# Patient Record
Sex: Male | Born: 1965 | Race: Black or African American | Hispanic: No | Marital: Single | Smoking: Never smoker
Health system: Southern US, Community
[De-identification: ages and names within clinical notes are randomized; demographics above are authoritative.]

## PROBLEM LIST (undated history)

## (undated) DIAGNOSIS — B20 Human immunodeficiency virus [HIV] disease: Secondary | ICD-10-CM

## (undated) DIAGNOSIS — I219 Acute myocardial infarction, unspecified: Secondary | ICD-10-CM

## (undated) DIAGNOSIS — W3400XA Accidental discharge from unspecified firearms or gun, initial encounter: Secondary | ICD-10-CM

## (undated) HISTORY — DX: Acute myocardial infarction, unspecified: I21.9

## (undated) HISTORY — DX: Accidental discharge from unspecified firearms or gun, initial encounter: W34.00XA

## (undated) HISTORY — DX: Human immunodeficiency virus (HIV) disease: B20

---

## 1989-09-04 DIAGNOSIS — W3400XA Accidental discharge from unspecified firearms or gun, initial encounter: Secondary | ICD-10-CM

## 1989-09-04 HISTORY — PX: ILEOCECETOMY: SHX5857

## 1989-09-04 HISTORY — DX: Accidental discharge from unspecified firearms or gun, initial encounter: W34.00XA

## 1993-09-04 DIAGNOSIS — B20 Human immunodeficiency virus [HIV] disease: Secondary | ICD-10-CM

## 1993-09-04 HISTORY — DX: Human immunodeficiency virus (HIV) disease: B20

## 2002-09-04 DIAGNOSIS — I219 Acute myocardial infarction, unspecified: Secondary | ICD-10-CM

## 2002-09-04 HISTORY — DX: Acute myocardial infarction, unspecified: I21.9

## 2005-10-17 ENCOUNTER — Ambulatory Visit: Payer: Self-pay | Admitting: Infectious Diseases

## 2005-10-17 ENCOUNTER — Encounter: Payer: Self-pay | Admitting: Infectious Diseases

## 2005-10-17 ENCOUNTER — Encounter (INDEPENDENT_AMBULATORY_CARE_PROVIDER_SITE_OTHER): Payer: Self-pay | Admitting: *Deleted

## 2005-10-17 LAB — CONVERTED CEMR LAB
CD4 Count: 700 microliters
CD4 T Cell Abs: 700

## 2005-11-27 ENCOUNTER — Encounter: Payer: Self-pay | Admitting: Infectious Diseases

## 2005-11-27 ENCOUNTER — Ambulatory Visit: Payer: Self-pay | Admitting: Infectious Diseases

## 2006-01-10 IMAGING — CR DG CHEST 1V PORT
1 series · 1 of 1 positions shown · non-contrast
Comparison: none

CLINICAL DATA: Chest pain in a nonsmoking.  
 PORTABLE CHEST:

[view not recorded]
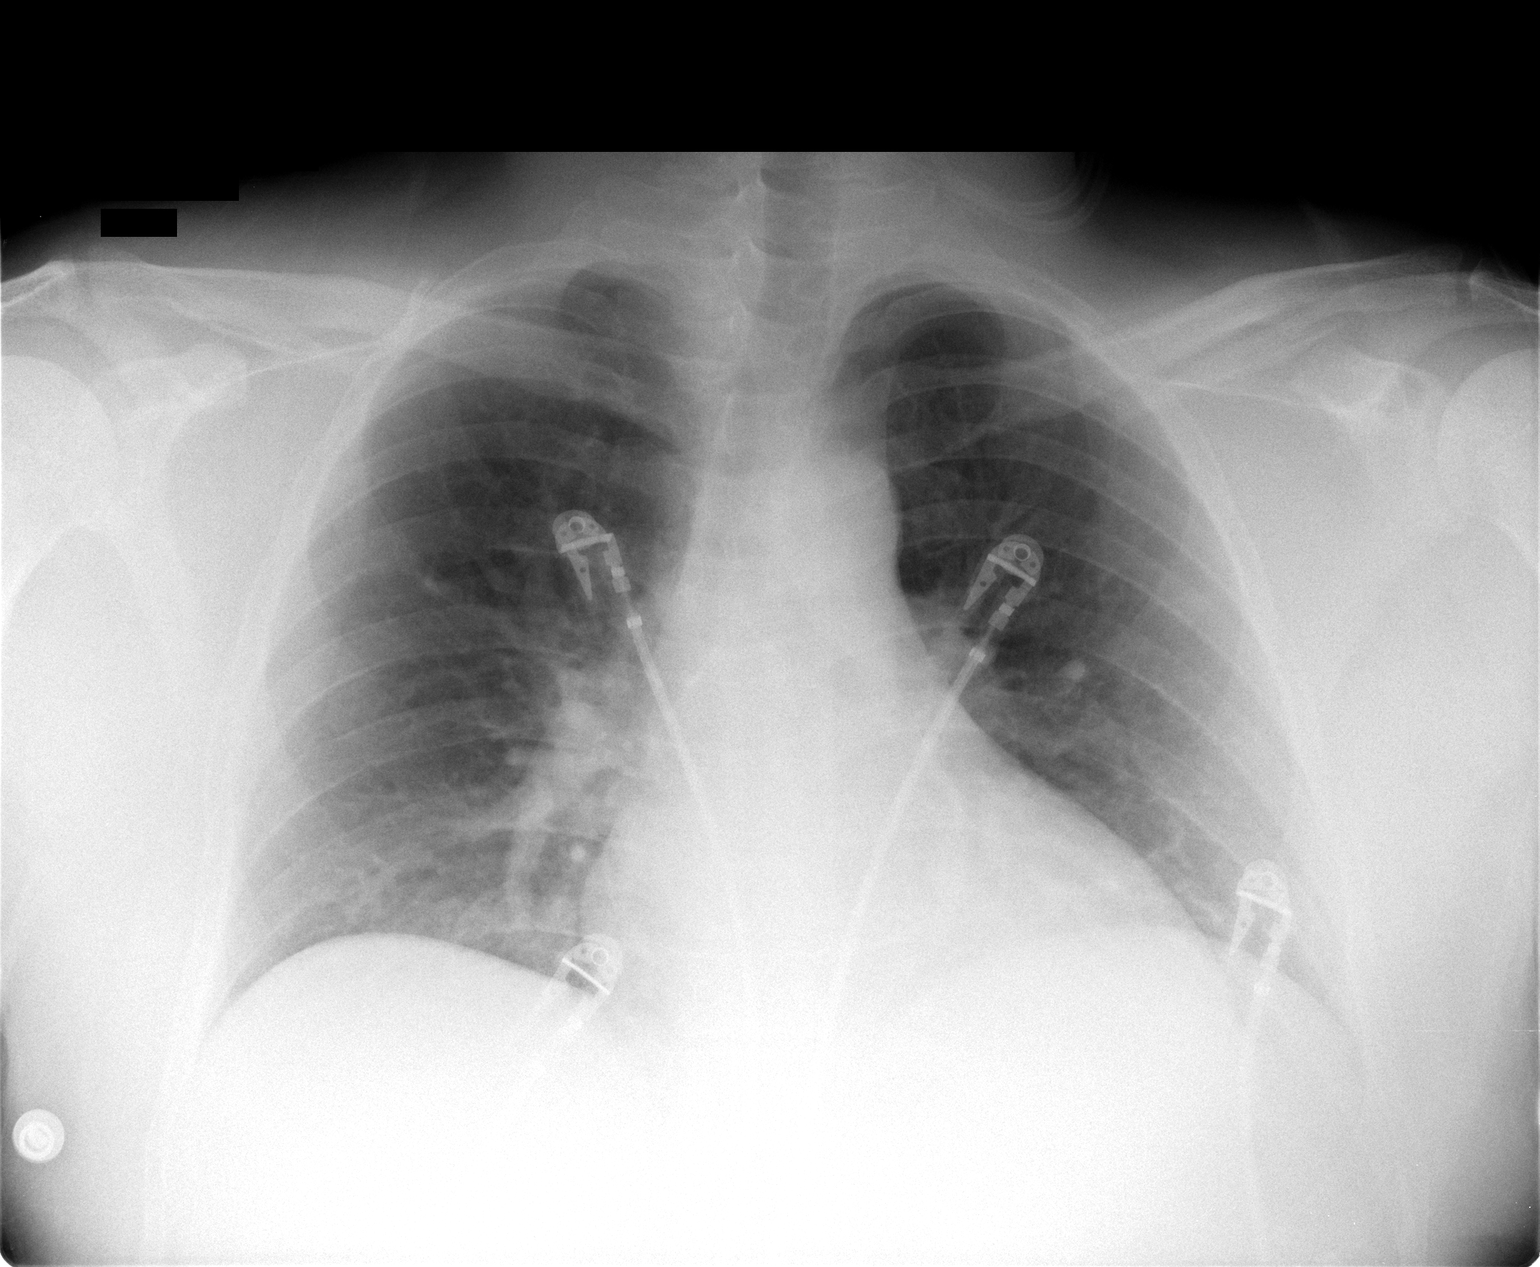

[1 of 1 positions shown; findings below may reference images not displayed]

FINDINGS: Lung volumes are low but the lungs are clear.  Heart size is normal.  No effusion or focal bony abnormality.
IMPRESSION: No acute disease.

## 2006-01-11 ENCOUNTER — Inpatient Hospital Stay (HOSPITAL_COMMUNITY): Admission: EM | Admit: 2006-01-11 | Discharge: 2006-01-12 | Payer: Self-pay | Admitting: Emergency Medicine

## 2006-10-29 ENCOUNTER — Encounter (INDEPENDENT_AMBULATORY_CARE_PROVIDER_SITE_OTHER): Payer: Self-pay | Admitting: *Deleted

## 2006-10-29 LAB — CONVERTED CEMR LAB

## 2006-11-11 ENCOUNTER — Encounter (INDEPENDENT_AMBULATORY_CARE_PROVIDER_SITE_OTHER): Payer: Self-pay | Admitting: *Deleted

## 2008-10-15 ENCOUNTER — Encounter: Payer: Self-pay | Admitting: Infectious Diseases

## 2009-11-11 ENCOUNTER — Ambulatory Visit: Payer: Self-pay | Admitting: Infectious Disease

## 2009-12-16 ENCOUNTER — Encounter: Payer: Self-pay | Admitting: Infectious Diseases

## 2009-12-20 ENCOUNTER — Ambulatory Visit: Payer: Self-pay | Admitting: Internal Medicine

## 2010-02-07 ENCOUNTER — Encounter (INDEPENDENT_AMBULATORY_CARE_PROVIDER_SITE_OTHER): Payer: Self-pay | Admitting: *Deleted

## 2010-02-24 ENCOUNTER — Ambulatory Visit: Payer: Self-pay | Admitting: Infectious Diseases

## 2010-04-14 ENCOUNTER — Ambulatory Visit: Payer: Self-pay | Admitting: Infectious Disease

## 2010-05-26 ENCOUNTER — Ambulatory Visit: Payer: Self-pay | Admitting: Internal Medicine

## 2010-09-04 HISTORY — PX: INCISION AND DRAINAGE: SHX5863

## 2010-10-04 NOTE — Miscellaneous (Signed)
Summary: Pt. @ [facility] clinic now, RW Form updated.  Clinical Lists Changes  Observations: Added new observation of PATNTCOUNTY: Duke Salvia (02/07/2010 16:25) Added new observation of RW VITAL STA: Relocated (02/07/2010 16:25) Added new observation of RWPARTICIP: No (02/07/2010 16:25)

## 2010-11-10 DIAGNOSIS — B20 Human immunodeficiency virus [HIV] disease: Secondary | ICD-10-CM

## 2010-12-06 ENCOUNTER — Other Ambulatory Visit: Payer: Self-pay | Admitting: *Deleted

## 2010-12-29 DIAGNOSIS — A4902 Methicillin resistant Staphylococcus aureus infection, unspecified site: Secondary | ICD-10-CM

## 2010-12-29 DIAGNOSIS — B20 Human immunodeficiency virus [HIV] disease: Secondary | ICD-10-CM

## 2010-12-29 DIAGNOSIS — Z23 Encounter for immunization: Secondary | ICD-10-CM
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
## 2011-01-25 ENCOUNTER — Inpatient Hospital Stay (HOSPITAL_COMMUNITY): Admission: AD | Admit: 2011-01-25 | Payer: Self-pay | Source: Ambulatory Visit | Admitting: Internal Medicine

## 2011-02-13 DIAGNOSIS — L0291 Cutaneous abscess, unspecified: Secondary | ICD-10-CM

## 2011-02-13 DIAGNOSIS — A4902 Methicillin resistant Staphylococcus aureus infection, unspecified site: Secondary | ICD-10-CM

## 2011-02-13 DIAGNOSIS — B20 Human immunodeficiency virus [HIV] disease: Secondary | ICD-10-CM

## 2011-02-13 DIAGNOSIS — R52 Pain, unspecified: Secondary | ICD-10-CM

## 2011-02-13 DIAGNOSIS — L0390 Cellulitis, unspecified: Secondary | ICD-10-CM

## 2011-03-16 DIAGNOSIS — B20 Human immunodeficiency virus [HIV] disease: Secondary | ICD-10-CM

## 2011-05-03 DIAGNOSIS — B20 Human immunodeficiency virus [HIV] disease: Secondary | ICD-10-CM

## 2011-05-03 DIAGNOSIS — L0293 Carbuncle, unspecified: Secondary | ICD-10-CM

## 2011-05-03 DIAGNOSIS — L0292 Furuncle, unspecified: Secondary | ICD-10-CM

## 2011-06-01 ENCOUNTER — Other Ambulatory Visit: Payer: Self-pay | Admitting: *Deleted

## 2011-06-01 NOTE — Telephone Encounter (Signed)
Dr Daiva Eves these Rx were sent over for this patient and they were not in his profile nor did they have any instructions. Being that they are new to the patient I wanted you to clarify what you intend for the patient to adhere to. Please advise and I will forward these to the patient pharmacy.  PLEASE ADVISE. tp

## 2011-06-05 ENCOUNTER — Other Ambulatory Visit: Payer: Self-pay | Admitting: *Deleted

## 2011-06-05 DIAGNOSIS — B20 Human immunodeficiency virus [HIV] disease: Secondary | ICD-10-CM

## 2011-06-05 MED ORDER — DARUNAVIR ETHANOLATE 400 MG PO TABS: 800.0000 mg | ORAL_TABLET | Freq: Every day | ORAL | Status: DC

## 2011-06-05 MED ORDER — EMTRICITABINE-TENOFOVIR DF 200-300 MG PO TABS: 1.0000 | ORAL_TABLET | Freq: Every day | ORAL | Status: DC

## 2011-06-05 MED ORDER — RITONAVIR 100 MG PO TABS: 100.0000 mg | ORAL_TABLET | Freq: Every day | ORAL | Status: DC

## 2011-07-06 DIAGNOSIS — B20 Human immunodeficiency virus [HIV] disease: Secondary | ICD-10-CM

## 2011-09-21 ENCOUNTER — Other Ambulatory Visit: Payer: Self-pay | Admitting: Licensed Clinical Social Worker

## 2011-09-21 DIAGNOSIS — B20 Human immunodeficiency virus [HIV] disease: Secondary | ICD-10-CM

## 2011-09-21 DIAGNOSIS — Z21 Asymptomatic human immunodeficiency virus [HIV] infection status: Secondary | ICD-10-CM

## 2011-09-21 MED ORDER — DARUNAVIR ETHANOLATE 800 MG PO TABS: 800.0000 mg | ORAL_TABLET | Freq: Every day | ORAL | Status: DC

## 2011-10-05 DIAGNOSIS — B20 Human immunodeficiency virus [HIV] disease: Secondary | ICD-10-CM

## 2012-02-06 DIAGNOSIS — B20 Human immunodeficiency virus [HIV] disease: Secondary | ICD-10-CM

## 2012-02-29 DIAGNOSIS — B20 Human immunodeficiency virus [HIV] disease: Secondary | ICD-10-CM

## 2012-10-31 DIAGNOSIS — B20 Human immunodeficiency virus [HIV] disease: Secondary | ICD-10-CM

## 2014-12-09 ENCOUNTER — Telehealth: Payer: Self-pay

## 2014-12-09 NOTE — Telephone Encounter (Signed)
[facility]. He is a known previousl positive who has not been in care.  [phone] Appointment given for labs and office visit.

## 2014-12-16 ENCOUNTER — Ambulatory Visit: Payer: PRIVATE HEALTH INSURANCE

## 2014-12-16 DIAGNOSIS — B20 Human immunodeficiency virus [HIV] disease: Secondary | ICD-10-CM

## 2014-12-17 LAB — URINALYSIS
Bilirubin Urine: NEGATIVE
GLUCOSE, UA: NEGATIVE mg/dL
Hgb urine dipstick: NEGATIVE
Ketones, ur: NEGATIVE mg/dL
LEUKOCYTES UA: NEGATIVE
Nitrite: NEGATIVE
PROTEIN: NEGATIVE mg/dL
SPECIFIC GRAVITY, URINE: 1.01 (ref 1.005–1.030)
UROBILINOGEN UA: 2 mg/dL — AB (ref 0.0–1.0)
pH: 6 (ref 5.0–8.0)

## 2014-12-17 LAB — COMPLETE METABOLIC PANEL WITH GFR
ALBUMIN: 2.7 g/dL — AB (ref 3.5–5.2)
ALT: 106 U/L — ABNORMAL HIGH (ref 0–53)
AST: 125 U/L — ABNORMAL HIGH (ref 0–37)
Alkaline Phosphatase: 77 U/L (ref 39–117)
BUN: 11 mg/dL (ref 6–23)
CHLORIDE: 103 meq/L (ref 96–112)
CO2: 30 meq/L (ref 19–32)
Calcium: 9 mg/dL (ref 8.4–10.5)
Creat: 0.46 mg/dL — ABNORMAL LOW (ref 0.50–1.35)
GLUCOSE: 120 mg/dL — AB (ref 70–99)
POTASSIUM: 4.3 meq/L (ref 3.5–5.3)
SODIUM: 135 meq/L (ref 135–145)
TOTAL PROTEIN: 7.3 g/dL (ref 6.0–8.3)
Total Bilirubin: 1.6 mg/dL — ABNORMAL HIGH (ref 0.2–1.2)

## 2014-12-17 LAB — CBC WITH DIFFERENTIAL/PLATELET
BASOS PCT: 0 % (ref 0–1)
Basophils Absolute: 0 10*3/uL (ref 0.0–0.1)
EOS ABS: 0.5 10*3/uL (ref 0.0–0.7)
EOS PCT: 10 % — AB (ref 0–5)
HEMATOCRIT: 39.7 % (ref 39.0–52.0)
Hemoglobin: 13.6 g/dL (ref 13.0–17.0)
Lymphocytes Relative: 46 % (ref 12–46)
Lymphs Abs: 2.3 10*3/uL (ref 0.7–4.0)
MCH: 30 pg (ref 26.0–34.0)
MCHC: 34.3 g/dL (ref 30.0–36.0)
MCV: 87.6 fL (ref 78.0–100.0)
MONO ABS: 0.6 10*3/uL (ref 0.1–1.0)
MONOS PCT: 12 % (ref 3–12)
MPV: 12.6 fL — ABNORMAL HIGH (ref 8.6–12.4)
Neutro Abs: 1.6 10*3/uL — ABNORMAL LOW (ref 1.7–7.7)
Neutrophils Relative %: 32 % — ABNORMAL LOW (ref 43–77)
PLATELETS: 54 10*3/uL — AB (ref 150–400)
RBC: 4.53 MIL/uL (ref 4.22–5.81)
RDW: 13.4 % (ref 11.5–15.5)
WBC: 4.9 10*3/uL (ref 4.0–10.5)

## 2014-12-17 LAB — HEPATITIS B CORE ANTIBODY, TOTAL: HEP B C TOTAL AB: NONREACTIVE

## 2014-12-17 LAB — HEPATITIS B SURFACE ANTIGEN: HEP B S AG: NEGATIVE

## 2014-12-17 LAB — LIPID PANEL
CHOL/HDL RATIO: 2.7 ratio
CHOLESTEROL: 98 mg/dL (ref 0–200)
HDL: 36 mg/dL — ABNORMAL LOW (ref >=40)
LDL Cholesterol: 50 mg/dL (ref 0–99)
Triglycerides: 61 mg/dL (ref <150)
VLDL: 12 mg/dL (ref 0–40)

## 2014-12-17 LAB — URINE CYTOLOGY ANCILLARY ONLY
Chlamydia: NEGATIVE
Neisseria Gonorrhea: NEGATIVE

## 2014-12-17 LAB — RPR

## 2014-12-17 LAB — T-HELPER CELL (CD4) - (RCID CLINIC ONLY)
CD4 % Helper T Cell: 13 % — ABNORMAL LOW (ref 33–55)
CD4 T CELL ABS: 300 /uL — AB (ref 400–2700)

## 2014-12-17 LAB — HEPATITIS A ANTIBODY, TOTAL: HEP A TOTAL AB: BORDERLINE — AB

## 2014-12-17 LAB — HEPATITIS C ANTIBODY: HCV Ab: REACTIVE — AB

## 2014-12-17 LAB — HEPATITIS B SURFACE ANTIBODY,QUALITATIVE: Hep B S Ab: POSITIVE — AB

## 2014-12-18 LAB — HIV-1 RNA ULTRAQUANT REFLEX TO GENTYP+
HIV 1 RNA QUANT: 28711 {copies}/mL — AB (ref <20)
HIV-1 RNA Quant, Log: 4.46 {Log} — ABNORMAL HIGH (ref <1.30)

## 2014-12-18 LAB — QUANTIFERON TB GOLD ASSAY (BLOOD)
INTERFERON GAMMA RELEASE ASSAY: NEGATIVE
MITOGEN VALUE: 2.32 [IU]/mL
QUANTIFERON TB AG MINUS NIL: 0 [IU]/mL
Quantiferon Nil Value: 0.28 IU/mL
TB Ag value: 0.19 IU/mL

## 2014-12-18 LAB — HEPATITIS C RNA QUANTITATIVE
HCV QUANT: 266545 [IU]/mL — AB (ref <15)
HCV Quantitative Log: 5.43 {Log} — ABNORMAL HIGH (ref <1.18)

## 2014-12-21 LAB — HLA B*5701: HLA-B*5701 w/rflx HLA-B High: NEGATIVE

## 2014-12-28 LAB — HIV-1 GENOTYPR PLUS

## 2014-12-29 ENCOUNTER — Ambulatory Visit (INDEPENDENT_AMBULATORY_CARE_PROVIDER_SITE_OTHER): Payer: PRIVATE HEALTH INSURANCE | Admitting: Infectious Diseases

## 2014-12-29 ENCOUNTER — Encounter: Payer: Self-pay | Admitting: Infectious Diseases

## 2014-12-29 VITALS — BP 149/92 | HR 87 | Temp 98.3°F | Ht 70.0 in | Wt 173.0 lb

## 2014-12-29 DIAGNOSIS — B182 Chronic viral hepatitis C: Secondary | ICD-10-CM

## 2014-12-29 DIAGNOSIS — B1920 Unspecified viral hepatitis C without hepatic coma: Secondary | ICD-10-CM | POA: Insufficient documentation

## 2014-12-29 DIAGNOSIS — B20 Human immunodeficiency virus [HIV] disease: Secondary | ICD-10-CM | POA: Diagnosis not present

## 2014-12-29 DIAGNOSIS — L853 Xerosis cutis: Secondary | ICD-10-CM | POA: Insufficient documentation

## 2014-12-29 DIAGNOSIS — E119 Type 2 diabetes mellitus without complications: Secondary | ICD-10-CM | POA: Diagnosis not present

## 2014-12-29 DIAGNOSIS — Z23 Encounter for immunization: Secondary | ICD-10-CM

## 2014-12-29 LAB — IRON: Iron: 205 ug/dL — ABNORMAL HIGH (ref 42–165)

## 2014-12-29 MED ORDER — EMTRICITABINE-TENOFOVIR DF 200-300 MG PO TABS: 1.0000 | ORAL_TABLET | Freq: Every day | ORAL | Status: DC

## 2014-12-29 MED ORDER — DARUNAVIR-COBICISTAT 800-150 MG PO TABS: 1.0000 | ORAL_TABLET | Freq: Every day | ORAL | Status: DC
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
## 2014-12-29 NOTE — Assessment & Plan Note (Signed)
Will get him PCP.  

## 2014-12-29 NOTE — Addendum Note (Signed)
Addended by: Autumn Pruitt C on: 12/29/2014 02:57 PM   Modules accepted: Orders, Medications

## 2014-12-29 NOTE — Assessment & Plan Note (Signed)
Will write him rx for aveno, states they don't have OTCs in jail.

## 2014-12-29 NOTE — Assessment & Plan Note (Addendum)
His CD4 has dropped significantly.  Will start him on prexcobix and trviada (til TAF-FTC comes out).  Will start pnvx and flu shot.  He is Hep B immune, hep a equivocal.  Will see him back in 6 weeks.

## 2014-12-29 NOTE — Assessment & Plan Note (Signed)
Will check genotype, Iron, ANA, PT/INR, elastogram.  See him back in 6 weeks.

## 2014-12-30 LAB — PROTIME-INR
INR: 1.27 (ref <1.50)
PROTHROMBIN TIME: 15.9 s — AB (ref 11.6–15.2)

## 2014-12-30 LAB — ANA: Anti Nuclear Antibody(ANA): NEGATIVE

## 2015-01-21 ENCOUNTER — Telehealth: Payer: Self-pay | Admitting: *Deleted

## 2015-01-21 NOTE — Telephone Encounter (Signed)
[facility] see if [facility] was still [facility], schedule follow up.  Patient is still there, has been given his medication since last visit.  Medical stated that they were able to manage his medication, asked when he should have labs drawn. As per Dr. Moshe CiproHatcher's note, labs should be redrawn 6 weeks after starting medication to see if it is working.  Dr. Ninetta LightsHatcher also requested that the patient be scheduled for elastography as per protocol for treatment of Hepatitis C.  Gave Medical the phone numbers to contact [facility] and [facility] for appointments. Will seek PA for elastography once it has been scheduled (pt has medicaid). Providence Lanius.Jaidence Geisler, Zachary GeorgeMichelle M, RN

## 2015-01-21 NOTE — Telephone Encounter (Signed)
Thank you so much for your thoroughness and dedication to excellence in the care of this patient

## 2015-01-27 ENCOUNTER — Telehealth: Payer: Self-pay | Admitting: *Deleted

## 2015-01-29 NOTE — Telephone Encounter (Addendum)
[facility] diagnosis of [facility].  Need documentation of plan of treatment of Hep C, including labs/tests required, to fax to detention center for approval.  Once they have the treatment plan, they will be able to schedule an elastography appointment. Andree CossHowell, Michelle M, RN

## 2015-02-03 NOTE — Telephone Encounter (Signed)
Prior to treatment plan, pt needs elastogram/fibrosis score, hepatitis C genotype. Can then decide on which therapy he needs and for what duration.

## 2015-02-03 NOTE — Telephone Encounter (Signed)
Thank you. I'll bring the orders for you to sign.

## 2015-02-04 ENCOUNTER — Other Ambulatory Visit: Payer: Self-pay | Admitting: Licensed Clinical Social Worker

## 2015-02-04 DIAGNOSIS — B20 Human immunodeficiency virus [HIV] disease: Secondary | ICD-10-CM

## 2015-02-04 MED ORDER — DARUNAVIR-COBICISTAT 800-150 MG PO TABS
1.0000 | ORAL_TABLET | Freq: Every day | ORAL | Status: AC
Start: 2015-02-04 — End: ?

## 2015-02-04 MED ORDER — EMTRICITABINE-TENOFOVIR DF 200-300 MG PO TABS: 1.0000 | ORAL_TABLET | Freq: Every day | ORAL | Status: AC

## 2015-03-01 ENCOUNTER — Telehealth: Payer: Self-pay | Admitting: *Deleted
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
## 2015-03-02 NOTE — Telephone Encounter (Signed)
Can you give me information on this please

## 2015-03-05 DEATH — deceased

## 2015-09-08 ENCOUNTER — Encounter: Payer: Self-pay | Admitting: *Deleted
